# Patient Record
Sex: Male | Born: 1945 | Race: White | Hispanic: No | Marital: Single
Health system: Southern US, Community
[De-identification: ages and names within clinical notes are randomized; demographics above are authoritative.]

---

## 2004-09-01 ENCOUNTER — Ambulatory Visit (HOSPITAL_COMMUNITY): Admission: RE | Admit: 2004-09-01 | Discharge: 2004-09-02 | Payer: Self-pay | Admitting: Neurosurgery

## 2004-12-05 ENCOUNTER — Ambulatory Visit (HOSPITAL_COMMUNITY): Admission: RE | Admit: 2004-12-05 | Discharge: 2004-12-06 | Payer: Self-pay | Admitting: Neurosurgery

## 2005-01-05 ENCOUNTER — Inpatient Hospital Stay (HOSPITAL_COMMUNITY): Admission: RE | Admit: 2005-01-05 | Discharge: 2005-01-08 | Payer: Self-pay | Admitting: Neurosurgery

## 2005-02-06 ENCOUNTER — Ambulatory Visit: Payer: Self-pay | Admitting: General Surgery

## 2005-12-10 ENCOUNTER — Ambulatory Visit: Payer: Self-pay | Admitting: Neurology

## 2010-08-27 ENCOUNTER — Emergency Department: Payer: Self-pay | Admitting: Emergency Medicine

## 2011-01-19 ENCOUNTER — Inpatient Hospital Stay: Payer: Self-pay | Admitting: Internal Medicine

## 2012-01-22 ENCOUNTER — Inpatient Hospital Stay: Payer: Self-pay | Admitting: Student

## 2012-01-22 LAB — CBC WITH DIFFERENTIAL/PLATELET
Basophil %: 0.2 %
Eosinophil %: 0 %
HGB: 14.5 g/dL (ref 13.0–18.0)
Lymphocyte %: 5.1 %
MCH: 29.7 pg (ref 26.0–34.0)
Monocyte #: 0.9 x10 3/mm (ref 0.2–1.0)
Monocyte %: 6 %
Neutrophil %: 88.7 %
RBC: 4.89 10*6/uL (ref 4.40–5.90)

## 2012-01-22 LAB — COMPREHENSIVE METABOLIC PANEL
Albumin: 2.9 g/dL — ABNORMAL LOW (ref 3.4–5.0)
Anion Gap: 11 (ref 7–16)
BUN: 18 mg/dL (ref 7–18)
Bilirubin,Total: 1.2 mg/dL — ABNORMAL HIGH (ref 0.2–1.0)
Creatinine: 0.79 mg/dL (ref 0.60–1.30)
EGFR (Non-African Amer.): >60
Glucose: 124 mg/dL — ABNORMAL HIGH (ref 65–99)
Potassium: 3.8 mmol/L (ref 3.5–5.1)
SGOT(AST): 21 U/L (ref 15–37)
Total Protein: 6.8 g/dL (ref 6.4–8.2)

## 2012-01-23 LAB — URINALYSIS, COMPLETE
Nitrite: POSITIVE
Protein: 100
Specific Gravity: 1.019 (ref 1.003–1.030)

## 2012-01-24 LAB — BASIC METABOLIC PANEL WITH GFR
Anion Gap: 10
BUN: 12 mg/dL
Calcium, Total: 8 mg/dL — ABNORMAL LOW
Chloride: 110 mmol/L — ABNORMAL HIGH
Co2: 23 mmol/L
Creatinine: 0.53 mg/dL — ABNORMAL LOW
EGFR (African American): >60
EGFR (Non-African Amer.): >60
Glucose: 83 mg/dL
Osmolality: 284
Potassium: 3.7 mmol/L
Sodium: 143 mmol/L

## 2012-01-24 LAB — CBC WITH DIFFERENTIAL/PLATELET
Basophil #: 0 x10 3/mm 3
Basophil %: 0.5 %
Eosinophil #: 0.2 x10 3/mm 3
Eosinophil %: 2.3 %
HCT: 39.2 % — ABNORMAL LOW
HGB: 13 g/dL
Lymphocyte %: 20.7 %
Lymphs Abs: 1.6 x10 3/mm 3
MCH: 30.4 pg
MCHC: 33.3 g/dL
MCV: 91 fL
Monocyte #: 0.8 "x10 3/mm "
Monocyte %: 10.7 %
Neutrophil #: 5 x10 3/mm 3
Neutrophil %: 65.8 %
Platelet: 216 x10 3/mm 3
RBC: 4.28 x10 6/mm 3 — ABNORMAL LOW
RDW: 13.1 %
WBC: 7.6 x10 3/mm 3

## 2012-01-24 LAB — URINE CULTURE

## 2012-01-26 LAB — CBC WITH DIFFERENTIAL/PLATELET
Basophil #: 0.1 10*3/uL (ref 0.0–0.1)
Eosinophil %: 3.2 %
Monocyte #: 1 x10 3/mm (ref 0.2–1.0)
Monocyte %: 9.7 %
Neutrophil %: 67.3 %
Platelet: 283 10*3/uL (ref 150–440)
RBC: 4.65 10*6/uL (ref 4.40–5.90)
WBC: 10.4 10*3/uL (ref 3.8–10.6)

## 2012-01-26 LAB — BASIC METABOLIC PANEL
Anion Gap: 12 (ref 7–16)
BUN: 5 mg/dL — ABNORMAL LOW (ref 7–18)
Calcium, Total: 7.9 mg/dL — ABNORMAL LOW (ref 8.5–10.1)
EGFR (African American): >60
EGFR (Non-African Amer.): >60
Glucose: 62 mg/dL — ABNORMAL LOW (ref 65–99)
Osmolality: 278 (ref 275–301)
Potassium: 3.3 mmol/L — ABNORMAL LOW (ref 3.5–5.1)

## 2012-01-28 LAB — POTASSIUM: Potassium: 3.9 mmol/L (ref 3.5–5.1)

## 2012-01-30 LAB — CULTURE, BLOOD (SINGLE)

## 2012-02-07 ENCOUNTER — Ambulatory Visit: Payer: Self-pay | Admitting: Internal Medicine

## 2012-02-21 ENCOUNTER — Inpatient Hospital Stay: Payer: Self-pay | Admitting: Internal Medicine

## 2012-02-21 LAB — URINALYSIS, COMPLETE
Bilirubin,UR: NEGATIVE
Nitrite: POSITIVE
Protein: 30
RBC,UR: 20 /HPF (ref 0–5)
WBC UR: 402 /HPF (ref 0–5)

## 2012-02-21 LAB — CBC WITH DIFFERENTIAL/PLATELET
Eosinophil %: 1.2 %
Lymphocyte #: 2.2 10*3/uL (ref 1.0–3.6)
MCH: 30.4 pg (ref 26.0–34.0)
MCV: 90 fL (ref 80–100)
Monocyte #: 0.9 x10 3/mm (ref 0.2–1.0)
Platelet: 341 10*3/uL (ref 150–440)
RBC: 5.34 10*6/uL (ref 4.40–5.90)

## 2012-02-21 LAB — BASIC METABOLIC PANEL
Anion Gap: 7 (ref 7–16)
Calcium, Total: 9 mg/dL (ref 8.5–10.1)
Co2: 25 mmol/L (ref 21–32)
Creatinine: 0.6 mg/dL (ref 0.60–1.30)
EGFR (African American): >60
Glucose: 76 mg/dL (ref 65–99)

## 2012-02-21 LAB — PROTIME-INR: INR: 1.1

## 2012-02-21 LAB — APTT: Activated PTT: 33.6 secs (ref 23.6–35.9)

## 2012-02-22 LAB — CBC WITH DIFFERENTIAL/PLATELET
Basophil %: 0.5 %
HCT: 43.6 % (ref 40.0–52.0)
HGB: 14.5 g/dL (ref 13.0–18.0)
Lymphocyte %: 9 %
MCHC: 33.3 g/dL (ref 32.0–36.0)
Monocyte %: 6 %
Neutrophil #: 17.4 10*3/uL — ABNORMAL HIGH (ref 1.4–6.5)
WBC: 20.7 10*3/uL — ABNORMAL HIGH (ref 3.8–10.6)

## 2012-02-23 LAB — CBC WITH DIFFERENTIAL/PLATELET
Basophil %: 0.2 %
Eosinophil %: 0.1 %
HGB: 14.1 g/dL (ref 13.0–18.0)
Lymphocyte %: 6.8 %
MCH: 28.8 pg (ref 26.0–34.0)
MCHC: 31.8 g/dL — ABNORMAL LOW (ref 32.0–36.0)
MCV: 91 fL (ref 80–100)
Monocyte %: 6.2 %
Platelet: 267 10*3/uL (ref 150–440)
RBC: 4.9 10*6/uL (ref 4.40–5.90)

## 2012-02-23 LAB — URINE CULTURE

## 2012-02-24 LAB — BASIC METABOLIC PANEL
Anion Gap: 6 — ABNORMAL LOW (ref 7–16)
Calcium, Total: 8.5 mg/dL (ref 8.5–10.1)
Chloride: 109 mmol/L — ABNORMAL HIGH (ref 98–107)
Co2: 27 mmol/L (ref 21–32)
Osmolality: 281 (ref 275–301)
Potassium: 3.6 mmol/L (ref 3.5–5.1)

## 2012-03-09 ENCOUNTER — Ambulatory Visit: Payer: Self-pay | Admitting: Internal Medicine

## 2012-03-09 DEATH — deceased

## 2012-05-05 IMAGING — CT CT HEAD WITHOUT CONTRAST
2 series · 15 of 30 positions shown, 19 images · non-contrast
Comparison: none

REASON FOR EXAM: FALL, LACERATION
COMMENTS:   May transport without cardiac monitor

PROCEDURE:     CT  - CT HEAD WITHOUT CONTRAST  - August 27, 2010 [DATE]
RESULT:
TECHNIQUE: Helical 5 mm sections were obtained from the skull base to the
vertex without the administration of intravenous contrast.

[Series 2: without · axial · non-contrast · 0.43mm/px · z∈[+429,+559]mm · 13 of 32 slices shown, 17 images]
[im 3/32  brain]
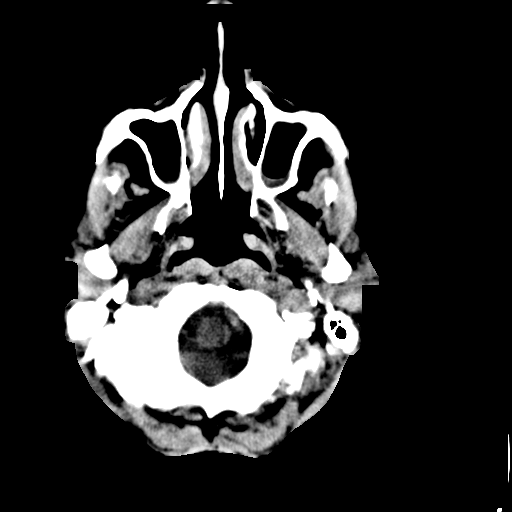
[im 3/32  bone]
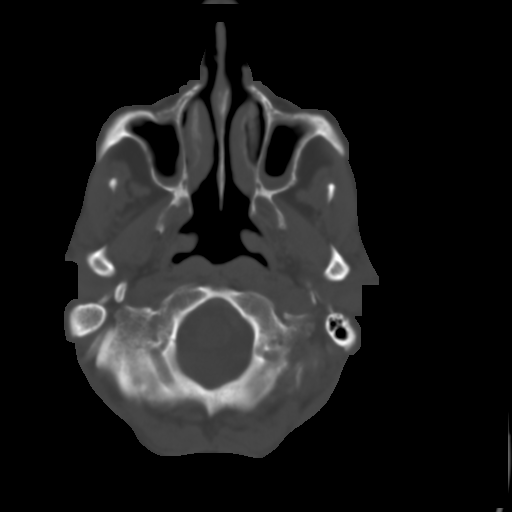
[im 5/32  brain]
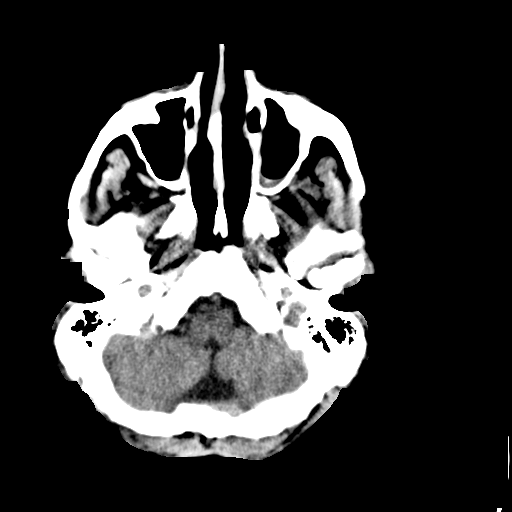
[im 7/32  brain]
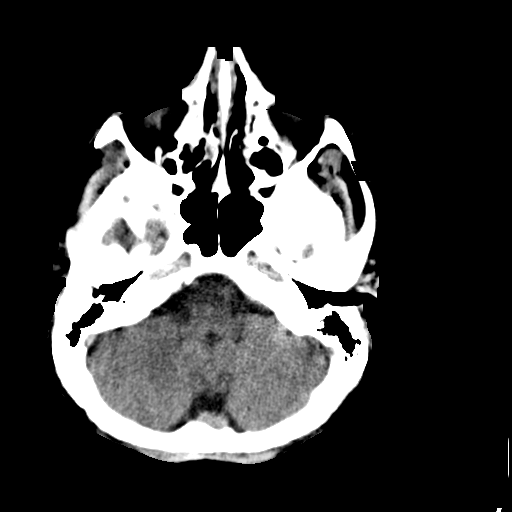
[im 9/32  brain]
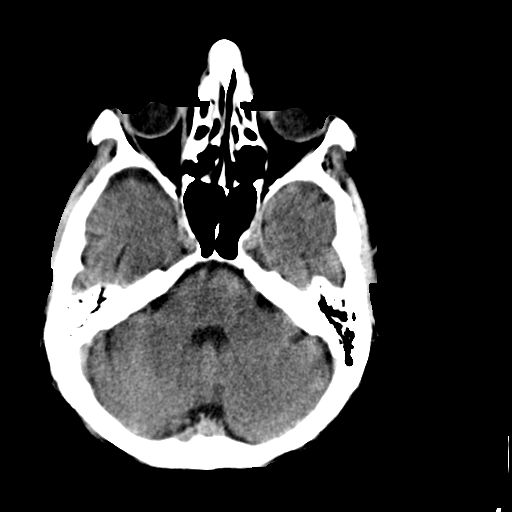
[im 12/32  brain]
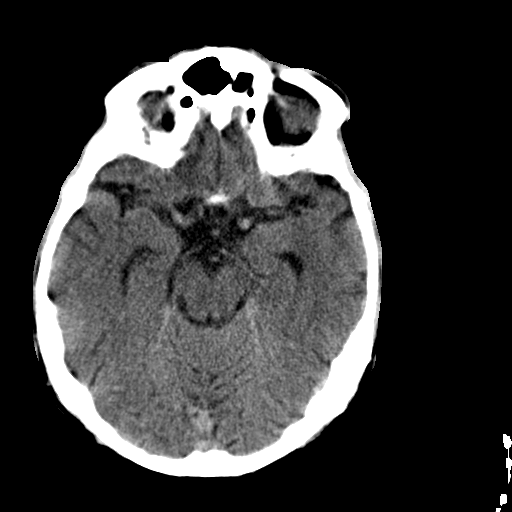
[im 12/32  bone]
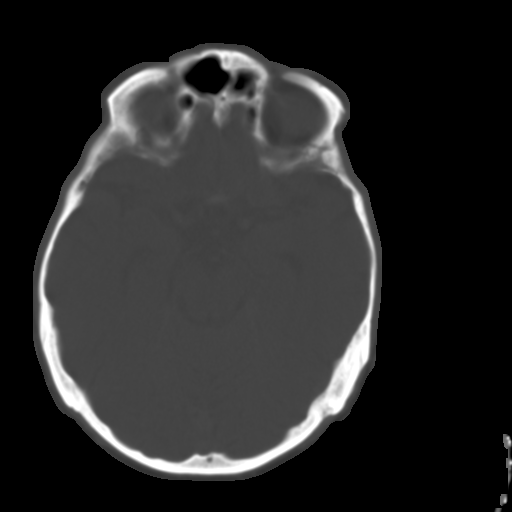
[im 14/32  brain]
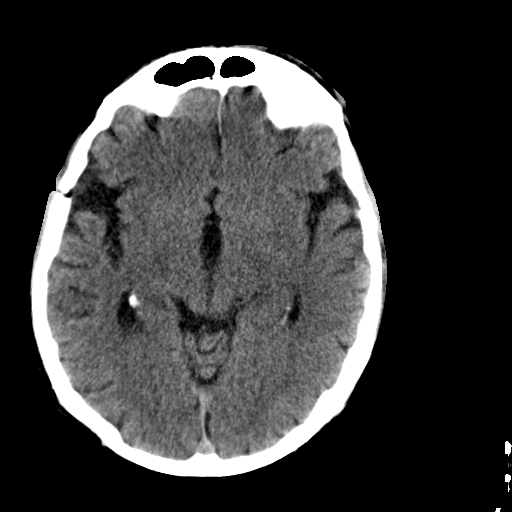
[im 16/32  brain]
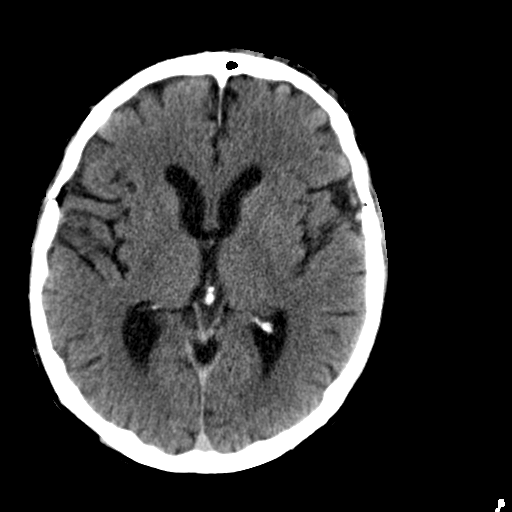
[im 18/32  brain]
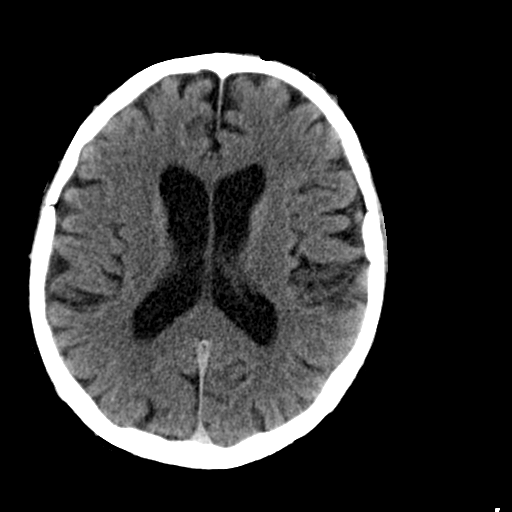
[im 20/32  brain]
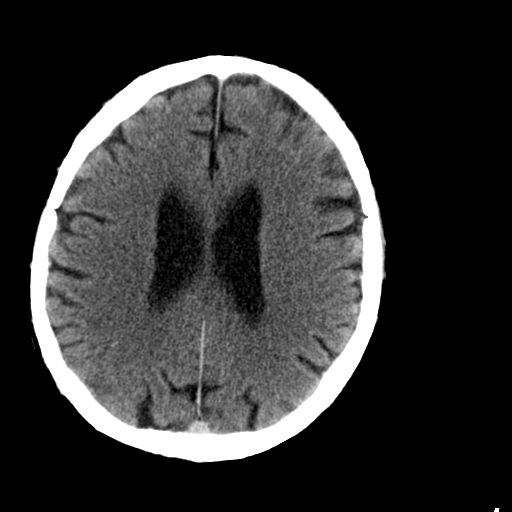
[im 20/32  bone]
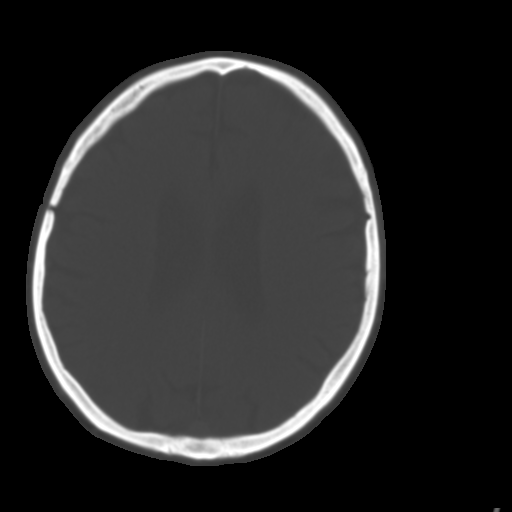
[im 23/32  brain]
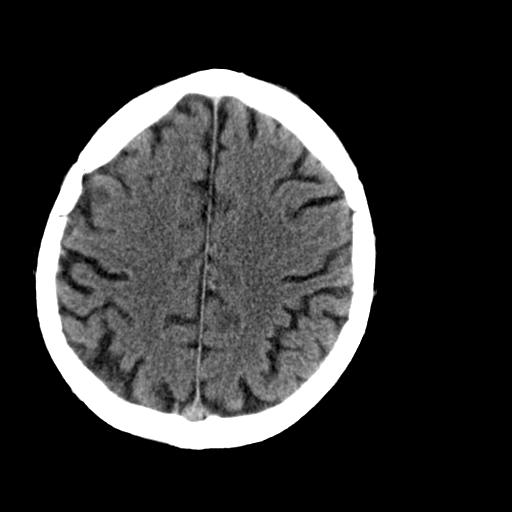
[im 25/32  brain]
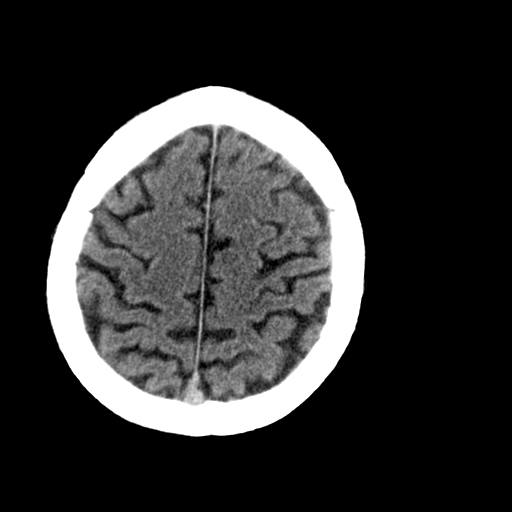
[im 27/32  brain]
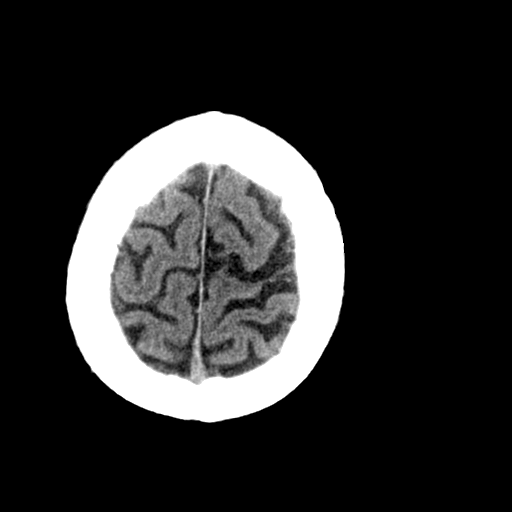
[im 29/32  brain]
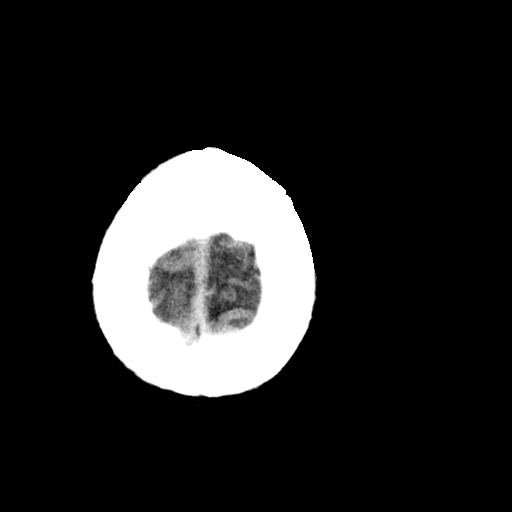
[im 29/32  bone]
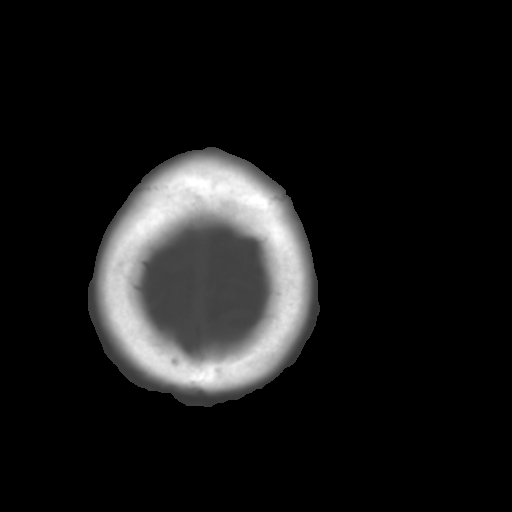

[Series 3: bone · axial · 0.43mm/px · z∈[+429,+449]mm · 2 of 32 slices shown]
[im 3/32  bone]
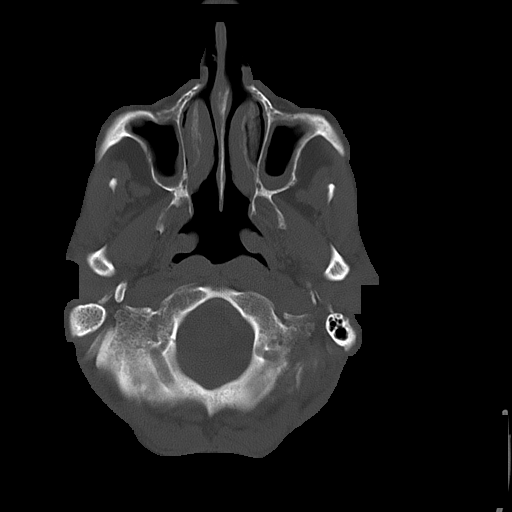
[im 7/32  bone]
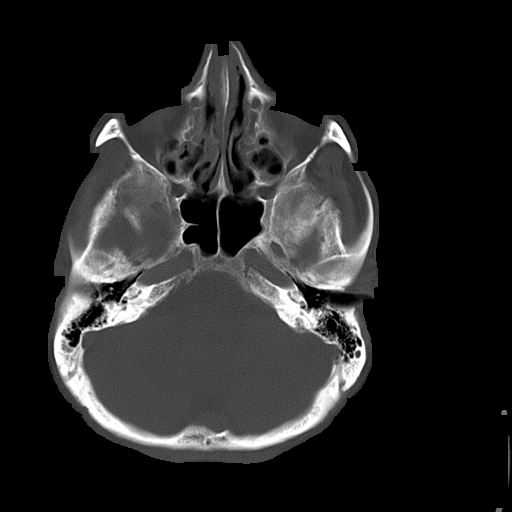

[15 of 30 positions shown; findings below may reference images not displayed]

FINDINGS: There is no evidence of intra-axial or extra-axial fluid
collection or evidence of acute hemorrhage. There is mild diffuse cortical
atrophy and mild areas of low attenuation within the subcortical, deep, and
periventricular white matter regions. The ventricles and cisterns are
patent. There is no evidence of a depressed skull fracture. Mucoperiosteal
thickening is identified within in the right and left maxillary sinus.
IMPRESSION: 1.     Mild involutional changes without evidence of focal or acute
abnormalities.
2.     Dr. Kizzie of the Emergency Department was informed of these findings
via a preliminary faxed report.

## 2013-09-30 IMAGING — CR DG CHEST 1V
1 series · 1 of 1 positions shown · non-contrast
Comparison: none

REASON FOR EXAM: lat view for left lung
COMMENTS:

PROCEDURE:     DXR - DXR CHEST 1 VIEWAP OR PA  - January 22, 2012 [DATE]
RESULT:     Lateral view of the chest reveals bilateral lung base
infiltrates. Cardiovascular structures unremarkable.

[w chest lat]
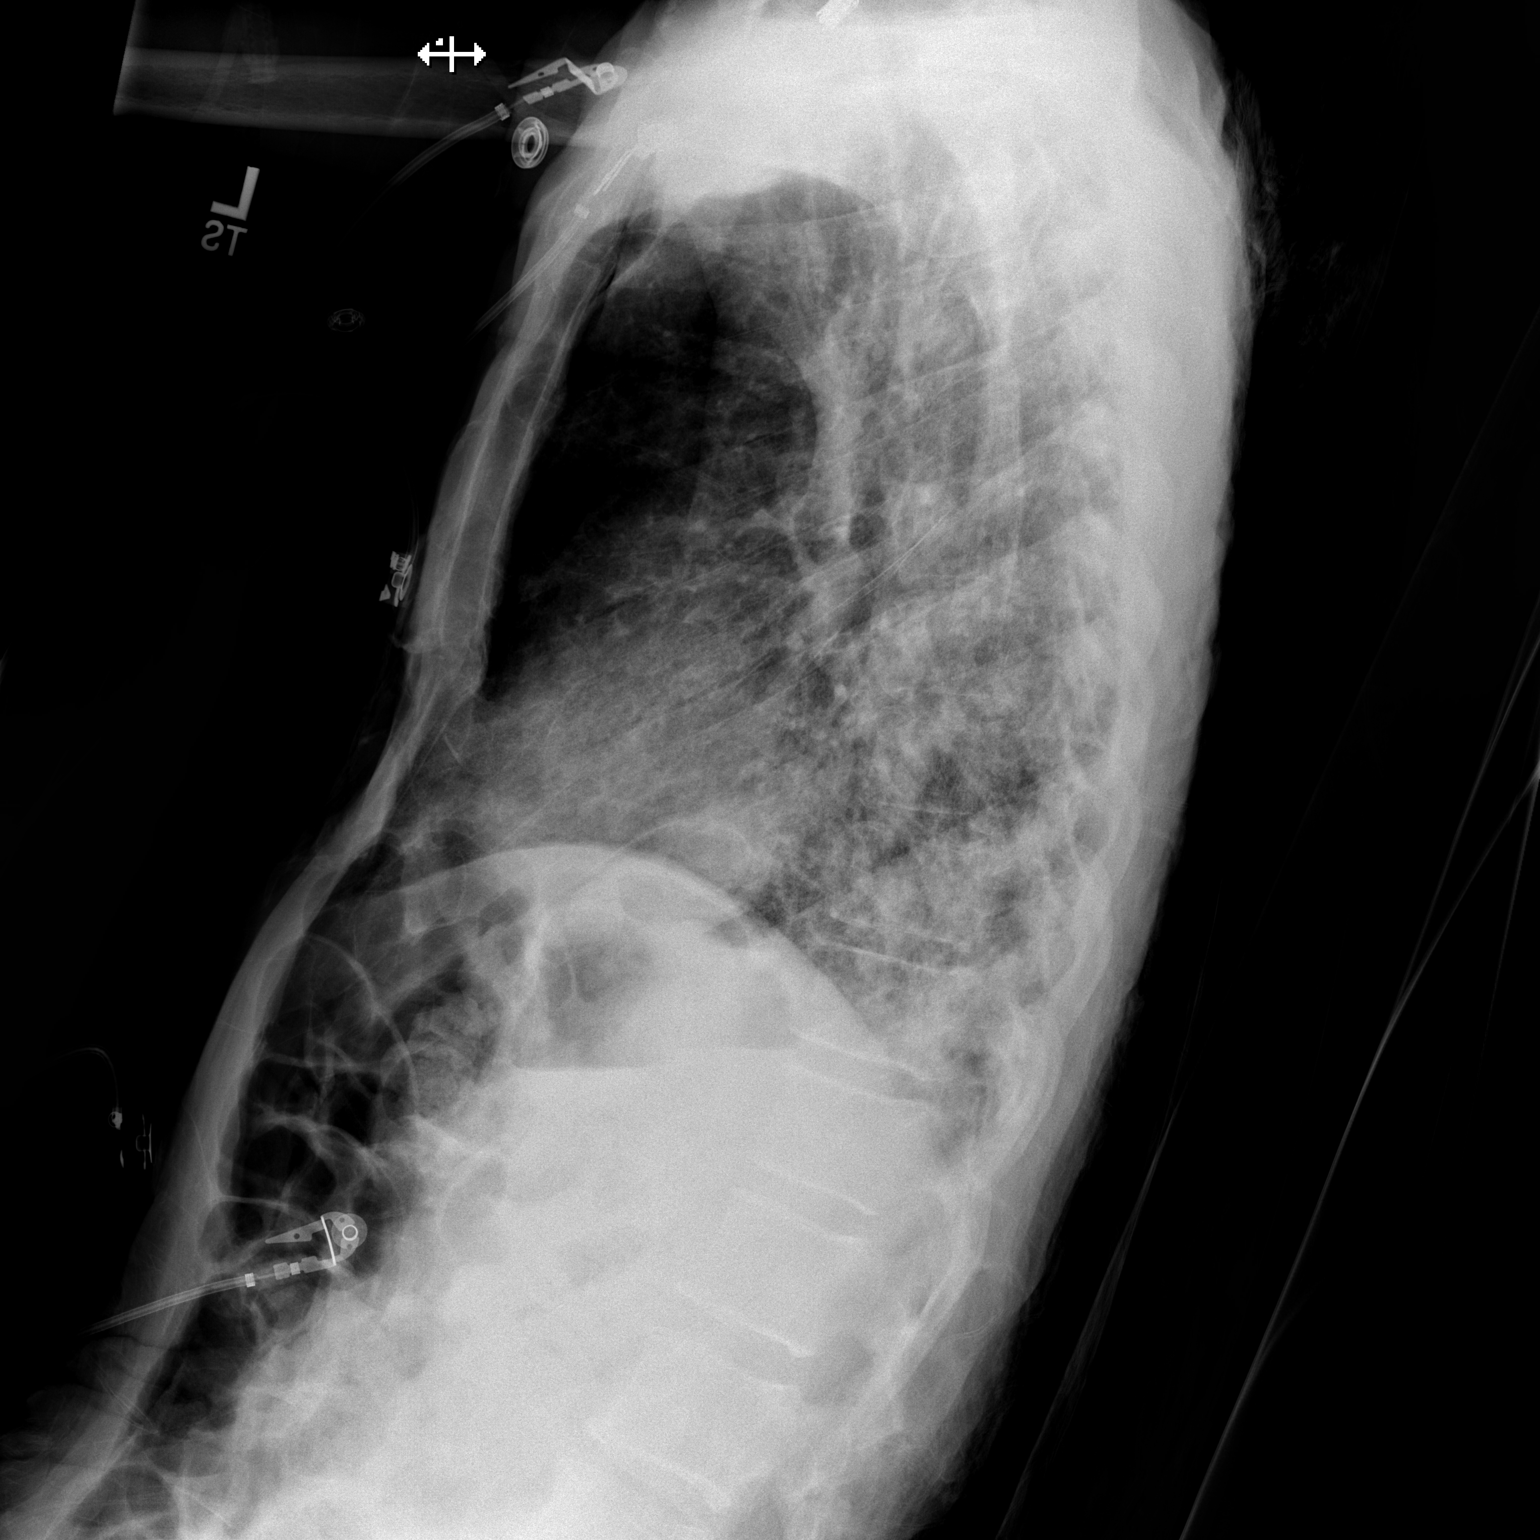

[1 of 1 positions shown; findings below may reference images not displayed]

IMPRESSION: Bibasal pneumonia. Left side greater than right.

## 2013-10-04 IMAGING — CR DG CHEST 1V PORT
1 series · 1 of 1 positions shown · non-contrast
Comparison: none

REASON FOR EXAM: pneumonia
COMMENTS:

PROCEDURE:     DXR - DXR PORTABLE CHEST SINGLE VIEW  - January 26, 2012  [DATE]
RESULT:     Bibasal infiltrates and atelectasis noted. Heart size normal.
Pulmonary vascularity normal.

[portable]
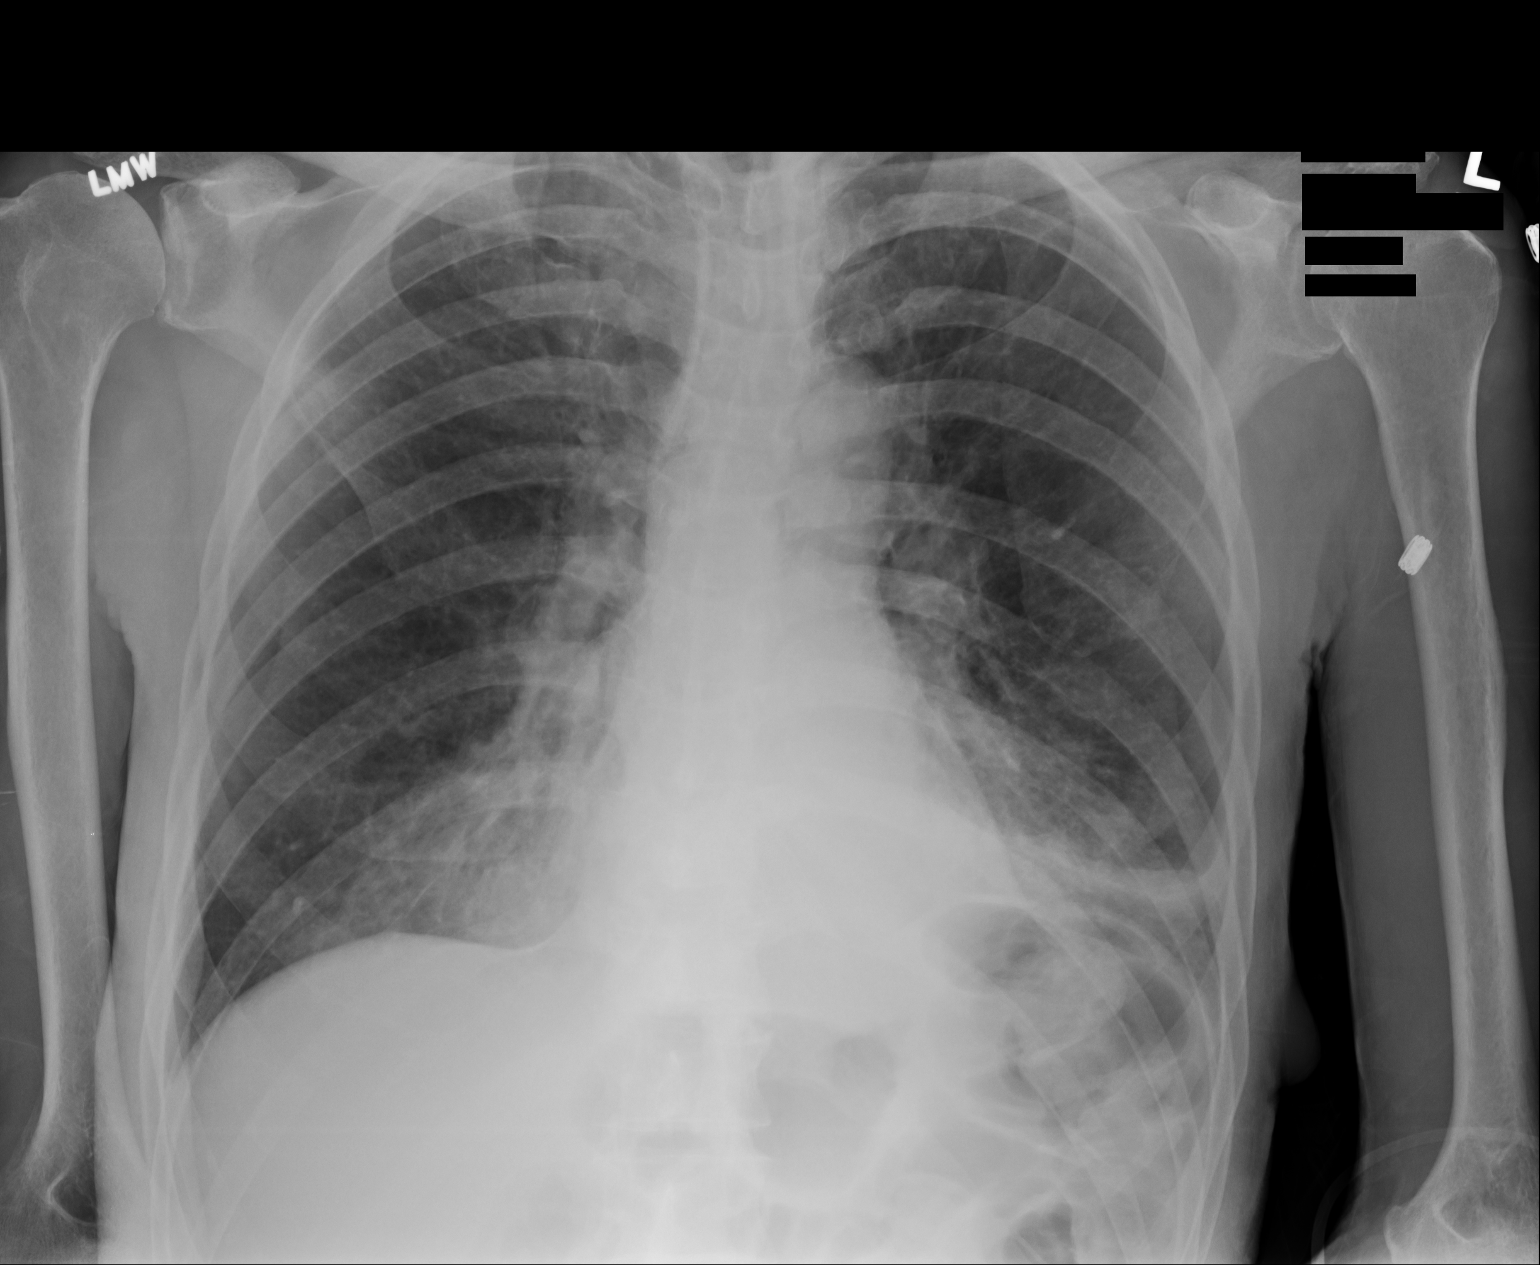

[1 of 1 positions shown; findings below may reference images not displayed]

IMPRESSION: Bibasal infiltrates and atelectasis both lung bases. These
findings have progressed from 01/22/2012.

## 2013-10-30 IMAGING — CR DG CHEST 2V
1 series · 2 of 2 positions shown · non-contrast
Comparison: none

REASON FOR EXAM: hyoxia
COMMENTS:

[Series 3: x chest ap · 0.14mm/px · 2 of 2 slices shown]
[im 1/2]
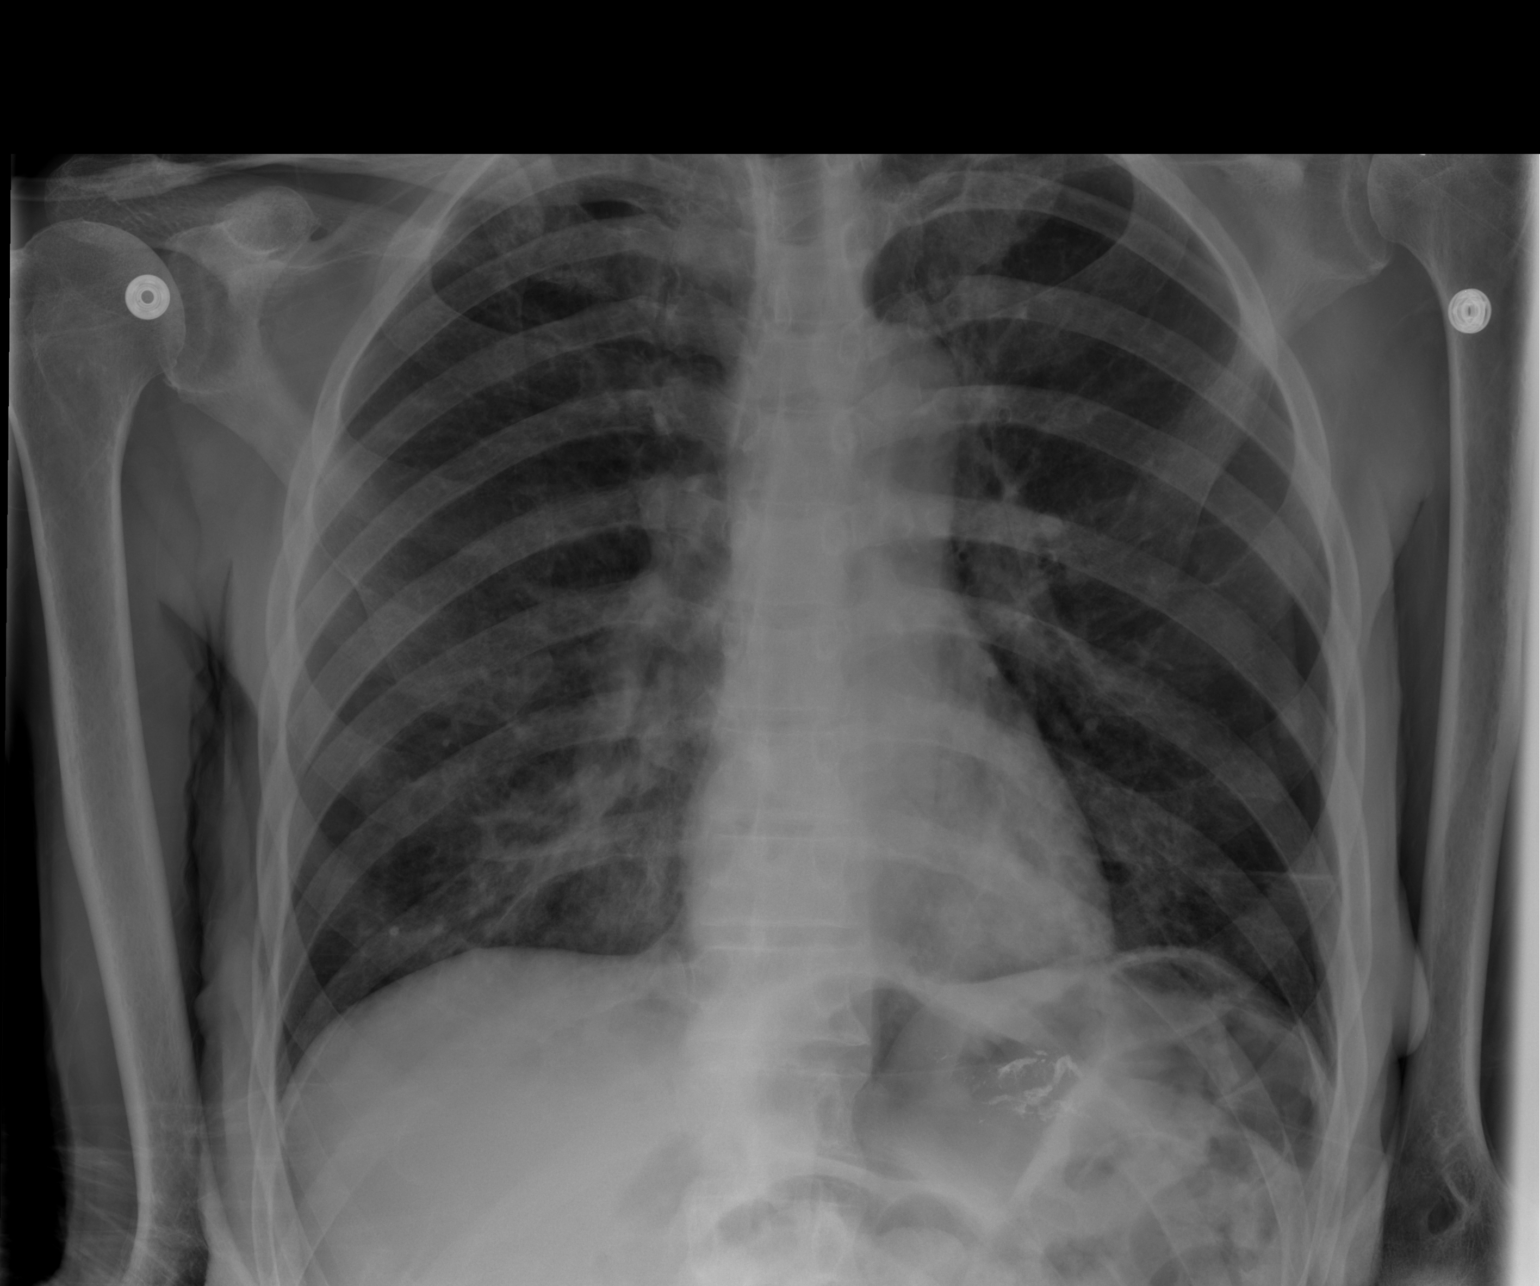
[im 2/2]
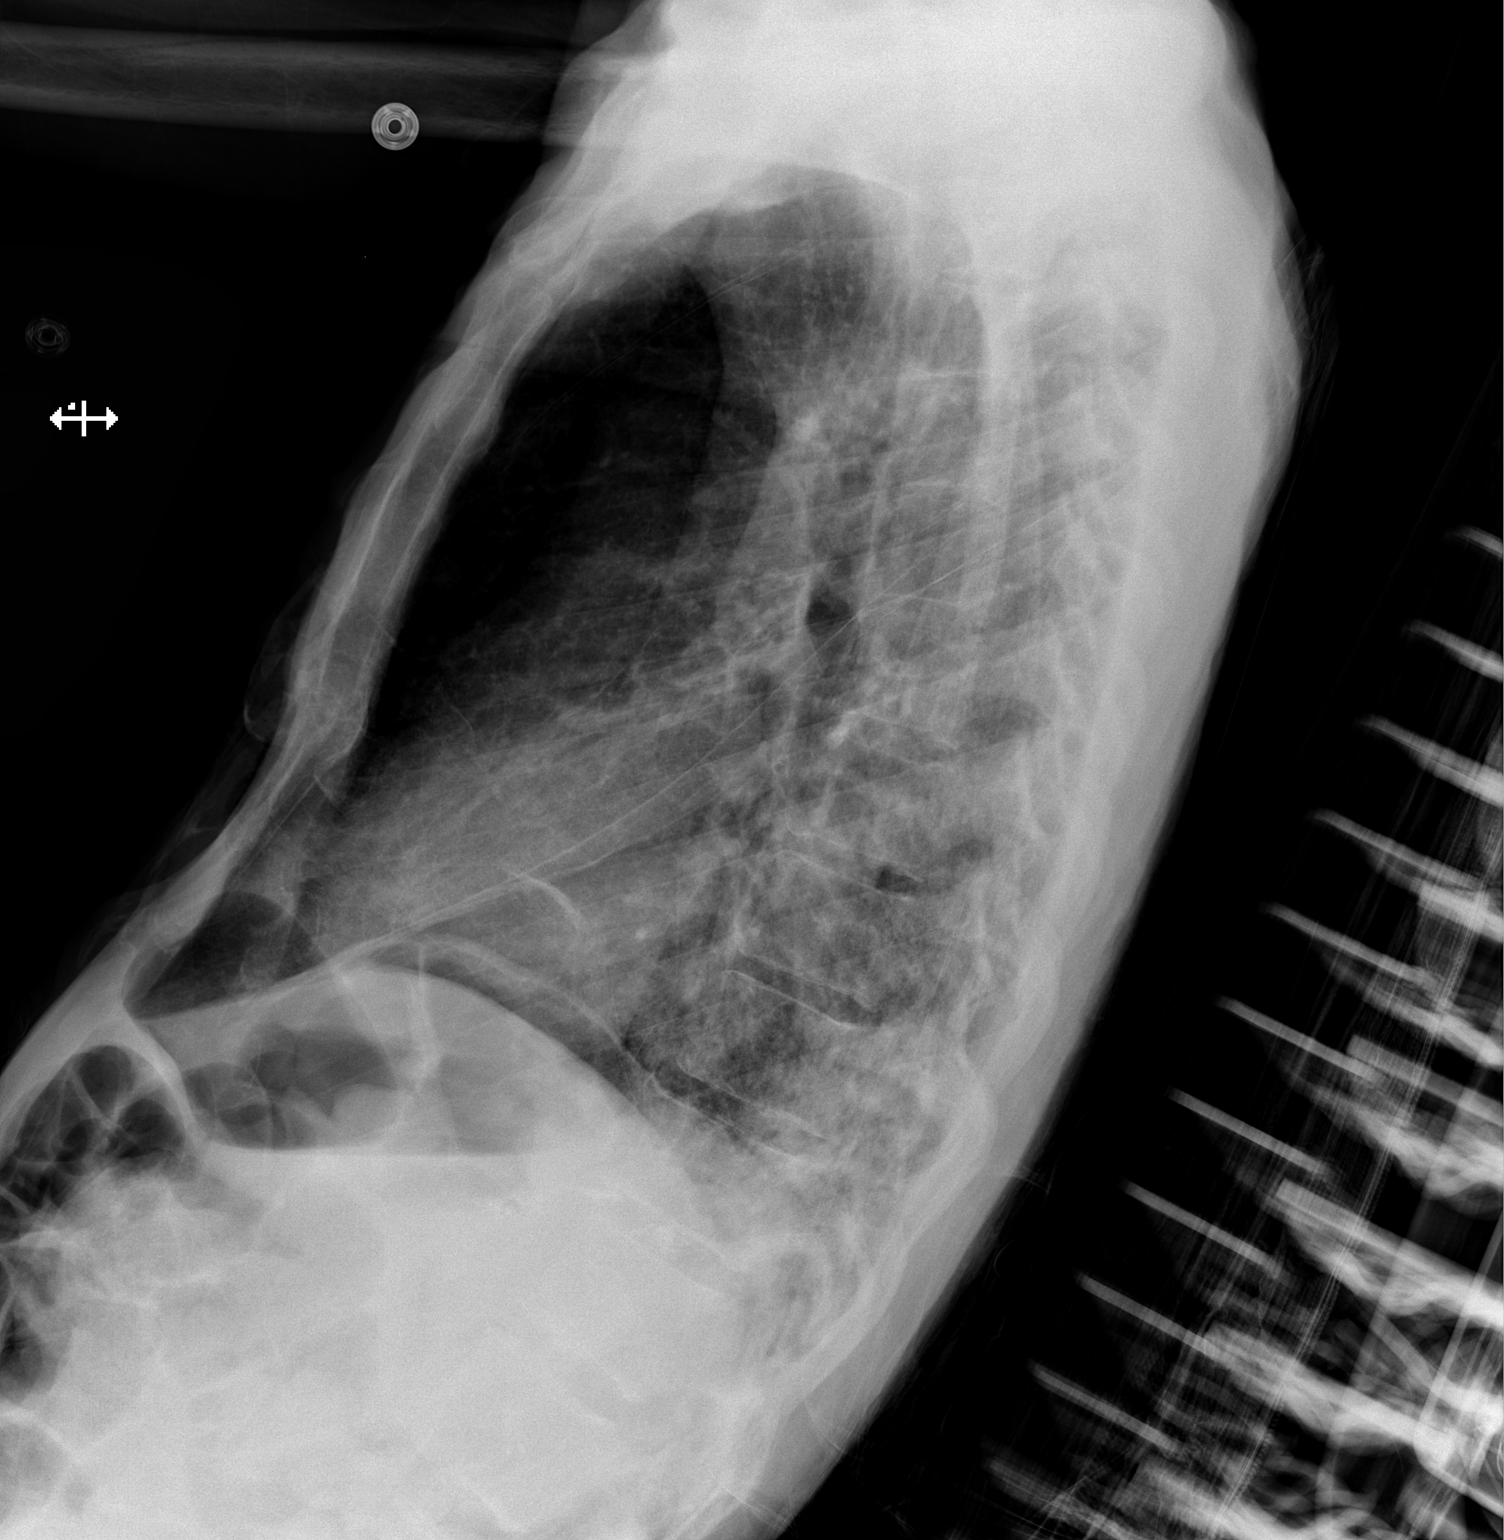

[2 of 2 positions shown; findings below may reference images not displayed]

PROCEDURE:     DXR - DXR CHEST PA (OR AP) AND LATERAL  - February 21, 2012  [DATE]

RESULT:     Comparison is made to the study 26 January, 2012.

The lungs are well-expanded. Density previously demonstrated at the lung
bases has improved. The cardiac silhouette is normal in size. The pulmonary
vascularity is not engorged. There remain increased lung markings in the
right infrahilar region. The mediastinum is normal in width. There is no
pleural effusion.
IMPRESSION: There has been improvement in the appearance of the lung
bases but there do remain increased densities at the right lung base and
possibly posteriorly at the left lung base consistent with atelectasis or
pneumonia. Continued followup films are recommended.

[REDACTED]

## 2014-10-26 NOTE — Consult Note (Signed)
Chief complaint is Parknison's disease and severe dysphagia.  Pt PEG cancelled due to respiratory problems with hypoxemia.  Discussed with niece who is in agreement.  I went to see patient at 7:30pm but he was asleep with BIPAP on and I did not awaken him.  Will see tomorrow.  If lungs improve will try to do PEG next week.  Electronic Signatures: Scot JunElliott, Robert T (MD)  (Signed on 16-Aug-13 19:42)  Authored  Last Updated: 16-Aug-13 19:42 by Scot JunElliott, Robert T (MD)

## 2014-10-26 NOTE — Consult Note (Signed)
Chief complaint is inablilty to swallow:  Patient with elevated WBC and ronchi and poor swallowing  with aspiration. Now on BIPAP for resp decompensation.  He might benefit from a Dubhoff tube which could allow meds and nourishment and may be small enough to not effect function of BIPAP.  Will speak to patient and Hospitalist.  Electronic Signatures: Scot JunElliott, Audray Rumore T (MD)  (Signed on 17-Aug-13 13:40)  Authored  Last Updated: 17-Aug-13 13:40 by Scot JunElliott, Jossiah Smoak T (MD)

## 2014-10-26 NOTE — Discharge Summary (Signed)
PATIENT NAME:  Mike Reynolds, Mike Reynolds MR#:  [id number] DATE OF BIRTH:  03-13-1946  DATE OF ADMISSION:  01/22/2012 DATE OF DISCHARGE:  01/28/2012  PRIMARY CARE PHYSICIAN: Dr. Vonita Moss  CHIEF COMPLAINT: Cough, shortness of breath.   DISCHARGE DIAGNOSES:  1. Systemic inflammatory response syndrome criteria, likely pneumonia from aspiration.  2. Parkinson's disease.  3. Hypokalemia.   SECONDARY DIAGNOSES:  1. Neurofibromatosis.  2. History of visual hallucinations attributed to his Parkinson's disease.   DISCHARGE MEDICATIONS:  1. Mapap 325 mg 2 tabs every four hours as needed for pain. 2. Carbidopa levodopa 50 mg/200 mg extended-release 1 tab orally 5 times a day for Parkinson's.  3. Dok 100 mg oral capsule 3 caps once a day at bedtime for constipation.  4. Finasteride 5 mg 1 tab once a day.  5. Tamsulosin 0.4 mg 1 tab once a day.  6. Cleocin 300 mg oral capsule 1 cap every six hours for two days only.   OXYGEN: Patient will be discharged with oxygen via nasal cannula 1 liter and please wean off as tolerated.   DIET: Low sodium, puree honey thick liquids. Patient requires feeding at meals. Small 1/2 tsp amounts of puree or honey liquids at a time with rest break between trials. Stop feeding if increased coughing or choking noted. Feed honey liquids by spoon.   ACTIVITY: As tolerated.   FOLLOW UP: Please follow up with your primary care physician within 1 to 2 weeks and get a follow-up x-ray of the chest in about four weeks for the pneumonia. If symptoms worsen or develop fever or increased shortness of breath call your primary care physician right away.   CODE STATUS: DO NOT RESUSCITATE.   HISTORY OF PRESENT ILLNESS: For full details of history and physical, please see the dictation on 01/23/2012 but briefly this is a 69 year old gentleman who lives at a nursing home with Parkinson's brought in for the above chief complaint. He had been having coughing spells and was short of breath  and did have a fall on the day of admission. He was noted to have pneumonia and admitted to the hospitalist service. On arrival, he was also hypotensive. He was started on vancomycin and Zosyn and admitted to the hospitalist service for further evaluation.  LABORATORY, DIAGNOSTIC AND RADIOLOGICAL DATA: Initial creatinine 0.79, sodium 140, potassium 3.8. Initial WBC 14.3, as of 07/20 it was 10.4. Blood cultures: No growth to date. Urine cultures contaminant. Urinalysis showed 25 RBCs, and 110 WBC, 3+ bacteria, positive nitrites, trace leukocyte esterase. CT of the head without contrast on arrival no acute abnormality. X-ray of the chest showing bibasilar pneumonia, left greater than the right. Repeat x-ray of the chest on 07/20 showing bibasilar infiltrate and atelectasis both lung bases. These findings have progressed from 01/22/2012.   HOSPITAL COURSE: Patient was admitted to the hospitalist service and started on vancomycin Zosyn. This was thought to be SIRS criteria of leukocytosis and tachycardia with increased respiratory rate on arrival, was noted to be likely secondary to be the pneumonia seen on x-ray of the chest. Patient had blood cultures sent. For the fall he did have a CT of the head which was negative for any acute events. He did have down trend in the leukocytosis and had no significant fevers. He has finished about seven days of IV antibiotics. Repeat x-ray was obtained as noted above which showed progression and therefore in addition to the vancomycin and Zosyn Levaquin was added on 07/20. Since then he has felt  dramatically improved. At this point his antibiotics have been stopped and he will be discharged on clindamycin for two more days. He did have a bout of hypokalemia which was corrected. Patient did have initiation of IV fluids initially for hypotension on arrival, however, it appears that patient has chronic hypotension and this is normal for him. Therefore IV fluids have been stopped  since. Otherwise his outpatient medications were resumed. At this point his oxygen has been weaned down to 0.5 liters and his cough and symptoms have been improved. He will be discharged back to the facility.   CODE STATUS: He is DO NOT RESUSCITATE.    TOTAL TIME SPENT: 35 minutes.    ____________________________ Mike EatonShayiq Syleena Mchan, MD sa:cms D: 01/28/2012 09:30:34 ET T: 01/28/2012 09:53:06 ET JOB#: [id number]  cc: Mike EatonShayiq Kimyatta Lecy, MD, <Dictator> Steele SizerMark A. Crissman, MD  Mike EatonSHAYIQ Tzipora Mcinroy MD ELECTRONICALLY SIGNED 02/01/2012 19:12

## 2014-10-26 NOTE — Consult Note (Signed)
CC inability to swallow.  Since he is to be a hospice patient and no PEG to be done I will sign off.  Reconsult if needed  Electronic Signatures: Scot JunElliott, Alyssa Rotondo T (MD)  (Signed on 19-Aug-13 11:23)  Authored  Last Updated: 19-Aug-13 11:23 by Scot JunElliott, Andreina Outten T (MD)

## 2014-10-26 NOTE — H&P (Signed)
PATIENT NAME:  Mike Reynolds, Slevin F MR#:  [id number] DATE OF BIRTH:  11/28/1945  DATE OF ADMISSION:  02/21/2012  PRIMARY CARE PHYSICIAN: Dr. Maryellen PileEason  REFERRING PHYSICIAN: Dr. Maryellen PileEason   CHIEF COMPLAINT: Dysphagia.   HISTORY OF PRESENT ILLNESS: Patient is a 69 year old Caucasian male with history of Parkinson's disease, neurofibromatosis, recent history of aspiration pneumonia admitted last month. He was sent from nursing home by Dr. Maryellen PileEason due to difficult swallowing since discharge last month. In addition, patient has weakness and weight loss. Swallowing study suggests patient has a high risk of aspiration, keep n.p.o. Patient is demented, cannot provide any detailed information. According to his brother patient recent had swallowing study suggest puree diet but still has poor oral intake, cannot eat properly.   PAST MEDICAL HISTORY:  1. Parkinson's disease. 2. Neurofibromatosis. 3. Recent aspiration pneumonia.   SOCIAL HISTORY: Patient is living in a nursing home. No smoking, alcohol drinking, or illicit drugs.   FAMILY HISTORY: Parents had heart attack, one of his brothers died of a heart attack.    ALLERGIES: No.  MEDICATIONS:  1. Flomax 0.4 mg p.o. daily.  2. MAPAP 325 mg p.o. 4 tablets q.4 hours p.r.n. for pain.  3. Finasteride 5 mg p.o. daily. 4. DOK 100 mg p.o. capsules 3 capsules once daily.  5. Cleocin HCI 300 mg p.o. every six hours.  6. Carbidopa, levodopa 50 mg/200 mg p.o. tablet 1 tab five times a day.   REVIEW OF SYSTEMS: Patient has Parkinson's disease. He is demented, could not provide any review of systems.   PHYSICAL EXAMINATION:  VITAL SIGNS: Temperature 97.6, blood pressure 100/55, pulse 88, respirations 26, oxygen saturation 85%.  GENERAL: Patient is weak but demented, in no acute distress.   HEENT: Pupils round, equal, reactive to light and accommodation. No discharge from ear or nose. Dry oral mucosa. Clear oropharynx.   NECK: Supple. No JVD or carotid bruits. No  lymphadenopathy. No thyromegaly.   PULMONARY: Bilateral air entry. No wheezing or rales. No use of accessory muscles to breathe but has weak breath sounds.   ABDOMEN: Soft. No distention or tenderness. No organomegaly. Bowel sounds present.   EXTREMITIES: No edema, clubbing, or cyanosis. No calf tenderness. Strong bilateral pedal pulses.   NEUROLOGIC: Patient is demented but follows commands. No focal deficit. Power 4/5. Sensation intact.   SKIN: No rash or jaundice. Patient is very thin in malnutrition status. Also skin is very dry.   LABORATORY, DIAGNOSTIC AND RADIOLOGICAL DATA: Glucose 76, BUN 14, creatinine 0.6, sodium 138, potassium 4.1, chloride 106, bicarbonate 25, WBC 13, hemoglobin 16.2, platelets 341, INR 1.1, PTT 33.6. Urinalysis is pending.   IMPRESSION:  1. Dysphagia.  2. Hypoxia.  3. Leukocytosis, rule out urinary tract infection or pneumonia.  4. Dehydration.  5. Malnutrition.  6. Parkinson's disease.  7. Neurofibromatosis.   PLAN OF TREATMENT:  1. Patient will be placed for observation. Will start IV fluid. Will get GI consult for possible PEG placement and also follow up dietitian for tube feeding.  2. For hypoxia we will start oxygen by nasal cannula and check chest x-ray and follow up CBC, urinalysis. 3. For dehydration, we will start IV fluid normal saline 100 mL/h.  4. GI and deep vein thrombosis prophylaxis.   Discussed the patient's situation and plan of treatment with patient's brother.   TIME SPENT: About 60 minutes.   ____________________________ Shaune PollackQing Aarion Kittrell, MD qc:cms D: 02/21/2012 16:10:45 ET T: 02/21/2012 17:36:40 ET JOB#: [id number]  cc: Shaune PollackQing Maryelizabeth Eberle, MD, <Dictator> Alden ServerErnest  Sydnee Cabal, MD Shaune Pollack MD ELECTRONICALLY SIGNED 02/23/2012 13:45

## 2014-10-26 NOTE — Consult Note (Signed)
PATIENT NAME:  Mike Reynolds, Mike Reynolds MR#:  [id number] DATE OF BIRTH:  [birth year]  DATE OF CONSULTATION:  02/21/2012  REFERRING PHYSICIAN:   CONSULTING PHYSICIAN:  Scot Junobert T. Bobie Caris, MD  CHIEF COMPLAINT: Dysphagia, inability to swallow.  HISTORY OF PRESENT ILLNESS: Patient is a 69 year old white male with history of Parkinson's disease, neurofibromatosis, recent history of aspiration pneumonia which caused him to be admitted to the hospital last month. He was sent from the nursing home to see Dr. Maryellen PileEason because of difficulty swallowing. Patient has weakness, weight loss, and recurrent aspirations. Dr. Maryellen PileEason contacted me, asked me to evaluate the patient for possible feeding gastrostomy.   PAST MEDICAL HISTORY:  1. Parkinson's disease, progressive.  2. Neurofibromatosis. 3. Recent aspiration pneumonia.   SOCIAL HISTORY: Does not smoke or drink. Lives in a nursing home. Power of attorney is a niece named Renae FickleJanet Hill, phone number [phone].  FAMILY HISTORY: Parents with heart attacks, brother died of a heart attack.   ALLERGIES: None.   MEDICATIONS:  1. Flomax 0.4 mg daily.  2. MAPAP 325, 4 tablets every four hours p.r.n. for pain.  3. Finasteride 5 mg daily. 4. DOK 100 mg 3 capsules daily.  5. Cleocin 300 mg every six hours. 6. Carbidopa levodopa 50/200, 1 tablet 5 times a day.   REVIEW OF SYSTEMS: Unable to obtain review of systems.     PHYSICAL EXAMINATION:  VITAL SIGNS: Temperature 97.6, blood pressure 100/55, pulse 88, respirations 24, oxygen sat 85%.   GENERAL: Patient is weak. He is able to answer questions and appears to understand the question, very difficult for him to get an answer out. He is having auditory rhonchi and coughing.   HEENT: Sclerae nonicteric. Conjunctivae negative. Tongue is dry.   NECK: Trachea is in the midline.   CHEST: Bilateral rhonchi upper airway sounds with diminished air flow.   HEART: Distant sounds. Because of his lung findings difficult to  auscultate. I hear no obvious murmurs.   ABDOMEN: Flat, nondistended. Bowel sounds are present. He has involuntary guarding of his abdomen, unable to palpate internal organs.   EXTREMITIES: No edema. No cyanosis.   SKIN: No jaundice.   LABORATORY, DIAGNOSTIC AND RADIOLOGICAL DATA: Glucose 76, BUN 14, creatinine 0.6, sodium 138, potassium 4.1, chloride 106, bicarbonate 25, white blood count 13, hemoglobin 16, platelets 341, INR 1.1, PTT 33.6.   ASSESSMENT: Inability to swallow.   PLAN: Admit patient to the hospital. Start him on oxygen. Check a chest x-ray for pneumonia. Consult surgeon for possible help with PEG placement.   ____________________________ Scot Junobert T. Saryah Loper, MD rte:cms D: 02/21/2012 20:27:28 ET T: 02/22/2012 05:32:18 ET  JOB#: [id number] cc: Scot Junobert T. Elianah Karis, MD, <Dictator> Serita ShellerErnest B. Maryellen PileEason, MD Scot JunOBERT T Nasser Ku MD ELECTRONICALLY SIGNED 02/26/2012 18:34

## 2014-10-26 NOTE — Consult Note (Signed)
Patient chief complaint is inability to swallow due to Parkinson disease.  He is doing well on nasal canula.  His anterior chest shows no ronchi or rales, his breathing rate is decreased. Abd flat non distended, bowel sounds present.   Will try for PEG tomorrow or Tuesday depending on surgeon availability.   Electronic Signatures: Scot JunElliott, Mandalyn Pasqua T (MD)  (Signed on 18-Aug-13 10:04)  Authored  Last Updated: 18-Aug-13 10:04 by Scot JunElliott, Nakkia Mackiewicz T (MD)

## 2014-10-26 NOTE — Consult Note (Signed)
Hospitalist spent considerable time talking to the patient;s HCPOA and the plan is now to go to hospice so will cancel planned PEG.  Electronic Signatures: Elliott,Scot Jun Robert T (MD)  (Signed on 18-Aug-13 10:44)  Authored  Last Updated: 18-Aug-13 10:44 by Scot JunElliott, Robert T (MD)

## 2014-10-26 NOTE — Consult Note (Signed)
PATIENT NAME:  Mike Reynolds, Mike Reynolds MR#:  [id number] DATE OF BIRTH:  1946-03-29  DATE OF CONSULTATION:  02/22/2012  REFERRING PHYSICIAN:  Dr. Mechele CollinElliott  CONSULTING PHYSICIAN:  Adella HareJ. Wilton Smith, MD  CHIEF COMPLAINT/HISTORY OF PRESENT ILLNESS: Patient actually has as current chief complaint of difficulty breathing although it is very difficult to understand what he is trying to say. He does appear to be in respiratory distress. His sister-in-law was present at the time of surgical evaluation. He has a history of Parkinson's disease, history of aspiration pneumonia just last month. Has had significant weight loss in recent months and was admitted in anticipation of having a feeding gastrostomy tube inserted for nutritional support.   PAST MEDICAL HISTORY:  1. Parkinson's disease. 2. Neurofibromatosis.  3. Recent aspiration pneumonia.  MEDICATIONS:  1. Flomax 0.4 mg daily.  2. MAPAP 325 mg every four hours p.r.n. for pain. 3. Finasteride 5 mg daily.  4. DOK 100 mg 3 capsules per day. 5. Cleocin 300 mg every six hours. 6. Carbidopa levodopa 50/200, 1 tablet 5 times per day.   SOCIAL HISTORY: He is currently living in a nursing home but has been admitted to the hospital. Does not smoke. Does not drink any alcohol. He has been in the nursing home for approximately two years.   FAMILY HISTORY: Positive for heart disease.   REVIEW OF SYSTEMS: He had a bowel movement approximately two days ago according to the sister-in-law and otherwise when asked questions patient does not talk so I can understand so it is very difficult to obtain review of systems.     PHYSICAL EXAMINATION:  GENERAL: On examination the patient is found in the hospital room, appears somewhat dyspneic and breathing heavily. Has an oxygen rebreather bag. He is very asthenic.   VITAL SIGNS: Weight 102, height 70 inches, BMI 14.6, temperature 97.8, pulse 112, respirations 30, latest blood pressure 107/66, pulse oximetry during  surgical consultation varied between 80% and 91%. He is very anesthetic.   HEENT: Pupils are about 2 mm and equal. His pharynx is clear.   NECK: No palpable mass.   LUNGS: Lung sounds with few fine rhonchi at the right base, otherwise clear with good excursion.   HEART: Sounds were barely audible, overshadowed by respiratory sounds.   ABDOMEN: Flat and rigid as he appears to be using abdominal muscles for respirations. Does have a small subcutaneous mass about 2 cm near the right costal margin.   EXTREMITIES: No dependent edema.   LABORATORY, DIAGNOSTIC AND RADIOLOGICAL DATA: I reviewed his chest x-ray done yesterday which does show some infiltration right lower lobe, appears to be some prominence of gas in the splenic flexure of the colon.   Creatinine 0.6, white blood count today 20,000, hemoglobin 14.5.   IMPRESSION:  1. Aspiration pneumonia. 2. Dysphagia.  3. Parkinson's disease.   RECOMMENDATIONS: I discussed with the patient and sister-in-law the role of percutaneous endoscopic gastrostomy.   At present the nurse is in contact with respiratory therapy to obtain BiPAP machine for respiratory support.   Discussed this with Dr. Mechele CollinElliott. Patient may be in too much respiratory distress to do percutaneous endoscopic gastrostomy today but I think his condition needs to be improved before surgical intervention. I have discussed with the family briefly the procedure and with Dr. Mechele CollinElliott and will follow.   ____________________________ Shela CommonsJ. Renda RollsWilton Smith, MD jws:cms D: 02/22/2012 13:26:00 ET T: 02/22/2012 13:52:45 ET JOB#: [id number]  cc: Adella HareJ. Wilton Smith, MD, <Dictator> Adella HareWILTON J SMITH MD ELECTRONICALLY SIGNED  02/22/2012 21:41 

## 2014-10-26 NOTE — H&P (Signed)
PATIENT NAME:  Mike Reynolds, Casin F MR#:  [id number] DATE OF BIRTH:  February 08, 1946  DATE OF ADMISSION:  01/23/2012  PRIMARY CARE PHYSICIAN: Dr. Vonita MossMark Crissman    CHIEF COMPLAINT: Cough, shortness of breath, and history of fall.   HISTORY OF PRESENT ILLNESS: Mr. Mike Reynolds is a 69 year old pleasant Caucasian male nursing home resident with history of Parkinson's disease. The patient was transported from the nursing home with complaint that he had been coughing and appears to be short of breath, and he fell today. The patient did not sustain any serious injuries. He has a bruise over the left forehead which appears minor, superficial skin abrasion. His CT scan of the head was negative. Evaluation here with chest x-ray reveals left lower lobe pneumonia. The patient denies any pain. He has no complaint other than the cough. He appears dehydrated with dry mucous membranes. The patient also was found to be hypotensive.   REVIEW OF SYSTEMS:  CONSTITUTIONAL: He denies having fever, no chills. He has fatigue.  EYES: No blurring of vision. No double vision. ENT: No hearing impairment. No sore throat. No dysphagia. CARDIOVASCULAR: He denies any chest pain. No syncope. No palpitations. RESPIRATORY: The patient has a dry cough. No chest pain. No hemoptysis. GASTROINTESTINAL: No abdominal pain. No vomiting. No diarrhea. GENITOURINARY: No dysuria or frequency of urination. MUSCULOSKELETAL: No joint pain or swelling. No muscular pain or swelling. INTEGUMENT: No skin rash. No ulcers. He has multiple skin nodules all over his body consistent with his neurofibromatosis. NEURO: No focal weakness. No seizure activity. No headache. PSYCH: No anxiety. No depression. ENDOCRINE: No polyuria or polydipsia. No heat or cold intolerance.   PAST MEDICAL HISTORY:  1. Parkinson's disease. 2. Neurofibromatosis.  3. History of visual hallucinations attributed to his Parkinson's disease.   SOCIAL HABITS: The patient is a nonsmoker. No history  of alcohol abuse.   SOCIAL HISTORY: He is single. Right now he resides in a local nursing home. He retired from working as a Naval architecttruck driver and he also worked in [employer], [employer].   FAMILY HISTORY: His mother died of a myocardial infarction at the age of 68. His father had a heart attack at the age of 68. He reports that two of his brothers died from [place]. One of them died from a heart attack.   ADMISSION MEDICATIONS:  1. Tamsulosin 0.4 mg once a day.  2. Finasteride 5 mg once a day.  3. Tylenol 325, 1 or 2 tablets q. 4 hours p.r.n.  4. Docusate sodium 100 mg, 3 capsules once a day. 5. Carbidopa with levodopa 50/200 mg taken six? times a day, at 6 in the morning, 8 in the morning, 12 noon, 4:00 p.m., and 8:00 p.m.   ALLERGIES: No known drug allergies.   PHYSICAL EXAMINATION:  VITAL SIGNS: Blood pressure was low at 86/54, respiratory rate 38, pulse 114, temperature 98, oxygen saturation 94% while the patient is on oxygen.   GENERAL APPEARANCE: Elderly male lying in bed with oxygen on. He appears tachypneic.   HEENT: Dry skin and mucous membranes. Mild pallor. No icterus. No cyanosis. Hearing was normal. Nasal mucosa, lips, and tongue appear dry. Eye examination revealed normal eyelids and conjunctivae, although the right conjunctiva appears slightly congested. Pupils were small, about 4 mm, equal, sluggishly reactive to light.   NECK: Supple. Trachea at midline. No thyromegaly. No cervical lymphadenopathy. No masses.   HEART: Normal S1, S2. No S3, S4. No murmur. No gallop. No carotid bruits.   RESPIRATORY: Tachypnea. The  patient is also using accessory muscles. He has shallow breathing. I could not hear rales. No wheezing.   ABDOMEN: Soft without tenderness. No hepatosplenomegaly. No masses. No hernias.   SKIN: No ulcers. No subcutaneous masses other than subcutaneous scattered nodules of variable sizes consistent with his neurofibromatosis.   MUSCULOSKELETAL: No joint swelling. No  clubbing.   NEUROLOGIC: Cranial nerves II through XII are intact. No focal motor deficit.   PSYCHIATRIC: The patient is alert, oriented to place, people, and time. He was accurate about the date and the year and the name of the hospital.   LABORATORY, DIAGNOSTIC, AND RADIOLOGICAL DATA: Chest x-ray showed left lower lobe infiltrate consistent with pneumonia. His EKG showed sinus tachycardia at a rate of 113 per minute, left axis deviation, nonspecific T wave abnormalities, otherwise unremarkable EKG. Serum glucose 124, BUN 18, creatinine 0.7, sodium 140, potassium 3.8. Liver function tests showed protein of 6.8, albumin 2.9, bilirubin 1.2. Normal liver transaminases. CBC showed white count of 14,000, hemoglobin 14, hematocrit 44, platelet count 235.   ASSESSMENT:  1. Left lower lobe pneumonia.  2. Hypotension, likely from dehydration but cannot rule out early sepsis.  3. Parkinson's disease. 4. Neurofibromatosis. 5. There is a possibility of underlying urinary tract infection. We are just obtaining a urine sample right now and it appears turbid and cloudy. I am awaiting urinalysis results.   PLAN:  We will admit the patient. Blood cultures were taken, urine for urinalysis and culture as well. Since the patient is a nursing home resident we will broaden the spectrum of antibiotics using Zosyn and vancomycin to cover for nosocomial pneumonia. The patient received boluses of normal saline. His blood pressure at one time was in the 70s systolic and he was tachycardic. Right now his blood pressure is up to 81/50 and pulse rate is down to 98 per minute. We will continue to hydrate the patient. I will continue his home medications as listed above.   The patient reported that he does have a LIVING WILL. His CODE STATUS is DO NOT RESUSCITATE and he has a formal paper for DO NOT RESUSCITATE signed by his primary care physician.         TIME SPENT: Time spent evaluating this patient took more than one  hour including reviewing his medical records. Communication with the patient also was difficult as the patient was very slow with his responses.    ____________________________ Carney Corners. Rudene Re, MD amd:bjt D: 01/23/2012 00:14:54 ET T: 01/23/2012 08:02:58 ET JOB#: [id number]  cc: Carney Corners. Rudene Re, MD, <Dictator> Steele Sizer, MD Karolee Ohs Dala Dock MD ELECTRONICALLY SIGNED 01/25/2012 4:54

## 2014-10-26 NOTE — Discharge Summary (Signed)
PATIENT NAME:  Mike Reynolds, Thuan F MR#:  [id number] DATE OF BIRTH:  [birth year]  DATE OF ADMISSION:  02/21/2012 DATE OF DISCHARGE:  02/25/2012  ADMITTING PHYSICIAN: Shaune PollackQing Chen, MD  DISCHARGING PHYSICIAN: Enid Baasadhika Orlando Thalmann, MD  PRIMARY CARE PHYSICIAN: Vonita MossMark Crissman, MD  CONSULTATIONS:  1. Ned GraceNancy Phifer, MD - Palliative Care. 2. Lynnae Prudeobert Elliott, MD - Gastroenterology. 3. Renda RollsWilton Smith, MD - Surgery for possible PEG tube placement.   DISCHARGE DIAGNOSES:  1. Acute respiratory failure.  2. Aspiration pneumonia.  3. Dysphagia.  4. Parkinson's disease.  5. Extended-spectrum beta-lactamase Klebsiella urinary tract infection.   DISCHARGE MEDICATIONS:  1. Sinemet 50/200 mg one tablet orally five times a day.  2. Roxanol 20 mg/mL, 0.5 to 1 mL every 1 to 2 hours p.r.n.  3. Ativan 0.5 to 1 mg every 2 to 4 hours p.r.n. for anxiety.  4. Ranitidine 150 mg p.o. every 12 hours.  5. ABHR suppository one per rectum every 4 to 6 hours p.r.n.   HOME OXYGEN: 6 liters.  DIET: As tolerated. The patient is being transferred to hospice home.   LABS AND IMAGING STUDIES: Sodium 142, potassium 3.6, chloride 109, bicarbonate 27, BUN 10, creatinine 0.46, glucose 77, and calcium 8.5. WBC 11.5 decreased from 21.9, hemoglobin 14.1, hematocrit 44.4, and platelet count 267.  Urinalysis is showing positive nitrites, 3+ leukocyte esterase, 2+ bacteria, and 400 WBCs.  Urine culture is growing Klebsiella pneumonia, ESBL.  Chest x-ray on admission showed increased densities of the right lung base and possibly left lung base posteriorly consistent with atelectasis or pneumonia.   BRIEF HOSPITAL COURSE: Mr. Mike Reynolds is a 69 year old elderly Caucasian male with past medical history significant for Parkinson's disease, neurofibromatosis, and history of aspiration pneumonia with similar admission last month who presents due to difficulty swallowing and breathing problems.  1. Aspiration/dysphagia likely secondary to worsening  Parkinson's disease. He was seen by gastroenterology and surgery in consultation because he failed modified barium swallow and will require a PEG tube. However, since the patient's respiratory status worsened and overall clinical deterioration, the family has decided to pursue hospice services so a PEG tube was never done.  2. Acute respiratory failure secondary to aspiration pneumonia. The patient was on BiPAP, but because he was under comfort care and BiPAP was very uncomfortable for him, he was changed over to nonrebreather, currently weaned to 6 liters nasal cannula. His saturations are well maintained, but he appears in some distress and also in pain. The patient will be transferred to hospice home on comfort medications.  Only his Sinemet will be continued, if he would be able to take orally.  3. Parkinson's disease, progressively worsening. The patient's family refused a NG tube in the hospital, which is understandable because of his continued deterioration and he is under hospice services. He has a guarded prognosis. His code status is DO NOT RESUSCITATE. The plan of care was explained to the patient's power of attorney and his niece, Ms. Renae FickleJanet Hill, who agreed. Hospice screen was ordered and the patient is being transferred to hospice home.   DISCHARGE CONDITION: Poor with guarded prognosis.          DISCHARGE DISPOSITION: Hospice home.   TIME SPENT ON DISCHARGE: 40 minutes.   ____________________________ Enid Baasadhika Lakota Schweppe, MD rk:slb D: 02/25/2012 14:55:26 ET T: 02/26/2012 12:07:40 ET JOB#: [id number]  cc: Enid Baasadhika Becky Colan, MD, <Dictator> Steele SizerMark A. Crissman, MD Enid BaasADHIKA Kenai Fluegel MD ELECTRONICALLY SIGNED 02/27/2012 12:44
